# Patient Record
Sex: Male | Born: 1940 | Race: White | Hispanic: No | Smoking: Never smoker
Health system: Southern US, Community
[De-identification: ages and names within clinical notes are randomized; demographics above are authoritative.]

## PROBLEM LIST (undated history)

## (undated) DIAGNOSIS — I10 Essential (primary) hypertension: Secondary | ICD-10-CM

## (undated) HISTORY — PX: OTHER SURGICAL HISTORY: SHX169

---

## 2013-01-14 ENCOUNTER — Encounter (HOSPITAL_COMMUNITY): Payer: Self-pay | Admitting: *Deleted

## 2013-01-14 ENCOUNTER — Emergency Department (HOSPITAL_COMMUNITY)
Admission: EM | Admit: 2013-01-14 | Discharge: 2013-01-14 | Disposition: A | Discharge status: Home / Self Care | Payer: Medicare Other | Attending: Emergency Medicine | Admitting: Emergency Medicine

## 2013-01-14 ENCOUNTER — Emergency Department (HOSPITAL_COMMUNITY): Payer: Medicare Other

## 2013-01-14 DIAGNOSIS — Z951 Presence of aortocoronary bypass graft: Secondary | ICD-10-CM | POA: Insufficient documentation

## 2013-01-14 DIAGNOSIS — Z79899 Other long term (current) drug therapy: Secondary | ICD-10-CM | POA: Insufficient documentation

## 2013-01-14 DIAGNOSIS — Z7901 Long term (current) use of anticoagulants: Secondary | ICD-10-CM | POA: Insufficient documentation

## 2013-01-14 DIAGNOSIS — I10 Essential (primary) hypertension: Secondary | ICD-10-CM | POA: Insufficient documentation

## 2013-01-14 DIAGNOSIS — Z7982 Long term (current) use of aspirin: Secondary | ICD-10-CM | POA: Insufficient documentation

## 2013-01-14 DIAGNOSIS — Y939 Activity, unspecified: Secondary | ICD-10-CM | POA: Insufficient documentation

## 2013-01-14 DIAGNOSIS — S6722XA Crushing injury of left hand, initial encounter: Secondary | ICD-10-CM

## 2013-01-14 DIAGNOSIS — S61209A Unspecified open wound of unspecified finger without damage to nail, initial encounter: Secondary | ICD-10-CM | POA: Insufficient documentation

## 2013-01-14 DIAGNOSIS — W230XXA Caught, crushed, jammed, or pinched between moving objects, initial encounter: Secondary | ICD-10-CM | POA: Insufficient documentation

## 2013-01-14 DIAGNOSIS — Y9289 Other specified places as the place of occurrence of the external cause: Secondary | ICD-10-CM | POA: Insufficient documentation

## 2013-01-14 DIAGNOSIS — S61219A Laceration without foreign body of unspecified finger without damage to nail, initial encounter: Secondary | ICD-10-CM

## 2013-01-14 DIAGNOSIS — S6710XA Crushing injury of unspecified finger(s), initial encounter: Secondary | ICD-10-CM | POA: Insufficient documentation

## 2013-01-14 HISTORY — DX: Essential (primary) hypertension: I10

## 2013-01-14 MED ORDER — AMOXICILLIN 500 MG PO CAPS: 500.0000 mg | ORAL_CAPSULE | Freq: Three times a day (TID) | ORAL | Status: DC

## 2013-01-14 NOTE — ED Provider Notes (Signed)
History    This chart was scribed for non-physician practitioner working with Corey Shi, MD by Leone Payor, ED Scribe. This patient was seen in room WTR9/WTR9 and the patient's care was started at 1918.   CSN: [id number]  Arrival date & time 01/14/13  1918   First MD Initiated Contact with Patient 01/14/13 2212      Chief Complaint  Patient presents with  . Extremity Laceration    The history is provided by the patient. No language interpreter was used.    Corey Chan is a 72 y.o. male who presents to the Emergency Department complaining of a new laceration to the left index finger that occurred about 3 hours ago. Pt states he slammed his finger in the car door, states the door completely closed. Pt believes his tetanus is up to date. Pt takes Coumadin daily.  No other injuries.    Pt is an occasional alcohol user but denies smoking.  Past Medical History  Diagnosis Date  . Hypertension     Past Surgical History  Procedure Laterality Date  . Heart bypass      mechanical mitral valve    History reviewed. No pertinent family history.  History  Substance Use Topics  . Smoking status: Never Smoker   . Smokeless tobacco: Not on file  . Alcohol Use: Yes     Comment: occassional       Review of Systems  Skin: Positive for wound.    Allergies  Review of patient's allergies indicates no known allergies.  Home Medications   Current Outpatient Rx  Name  Route  Sig  Dispense  Refill  . aspirin EC 81 MG tablet   Oral   Take 81 mg by mouth daily.         Marland Kitchen atenolol (TENORMIN) 50 MG tablet   Oral   Take 25 mg by mouth daily.         Marland Kitchen atorvastatin (LIPITOR) 10 MG tablet   Oral   Take 10 mg by mouth daily.         . tamsulosin (FLOMAX) 0.4 MG CAPS   Oral   Take 0.4 mg by mouth daily.         Marland Kitchen warfarin (COUMADIN) 5 MG tablet   Oral   Take 5 mg by mouth daily.           BP 134/87  Pulse 77  Temp(Src) 98.4 F (36.9 C) (Oral)  Resp 20   SpO2 96%  Physical Exam  Nursing note and vitals reviewed. Constitutional: He is oriented to person, place, and time. He appears well-developed and well-nourished. No distress.  HENT:  Head: Normocephalic and atraumatic.  Eyes: EOM are normal.  Neck: Neck supple. No tracheal deviation present.  Cardiovascular: Normal rate.   Pulmonary/Chest: Effort normal. No respiratory distress.  Musculoskeletal: Normal range of motion.  Full ROM of left index finger. Normal sensation over distal phalanx.  Good cap refill < 2 seconds. Good strength with flexion and extension against resistance. Tender to the distal aspect of distal phalanx.     Neurological: He is alert and oriented to person, place, and time.  Skin: Skin is warm and dry.  2 cm laceration to the distal phalanx over radial aspect. Hemostatic.   Psychiatric: He has a normal mood and affect. His behavior is normal.    ED Course  Procedures (including critical care time)  DIAGNOSTIC STUDIES: Oxygen Saturation is 96% on room air, adequate by my interpretation.  COORDINATION OF CARE: 10:23 PM Discussed treatment plan with pt at bedside and pt agreed to plan.    LACERATION REPAIR Performed by: Lottie Mussel Authorized by: Jaynie Crumble A Consent: Verbal consent obtained. Risks and benefits: risks, benefits and alternatives were discussed Consent given by: patient Patient identity confirmed: provided demographic data Prepped and Draped in normal sterile fashion Wound explored  Laceration Location: left index finger  Laceration Length: 2cm  No Foreign Bodies seen or palpated  Anesthesia: local infiltration  Local anesthetic: lidocaine 2% wo epinephrine  Anesthetic total: 2 ml  Irrigation method: syringe Amount of cleaning: standard  Skin closure: prolene 5.0  Number of sutures: 5  Technique: simple interrupted  Patient tolerance: Patient tolerated the procedure well with no immediate  complications.  1. Laceration of finger, initial encounter   2. Crushing injury of finger of left hand, initial encounter       MDM  Pt with left distal finger laceration and crush injury. Infer laceration repaired. Fixed with sutures. He tolerated procedure well. Possible non displaced fx of the distal middle phalanx. Pt has no pain over that area. Pt's pain is mainly to the distal phalanx and tip of the finger. Pt will be started on amoxil due to his hx of valve replacement. Follow up with pcp for suture removal.    I personally performed the services described in this documentation, which was scribed in my presence. The recorded information has been reviewed and is accurate.   Lottie Mussel, PA-C 01/15/13 [phone]

## 2013-01-14 NOTE — ED Notes (Signed)
Wound dressed with bacitracin, gauze and coban.

## 2013-01-14 NOTE — ED Notes (Signed)
Pt ambulatory to exam room with steady gait.  

## 2013-01-14 NOTE — ED Notes (Signed)
Pt slammed in left forefinger in car door,  Bleeding controlled,

## 2013-01-15 NOTE — ED Provider Notes (Signed)
Medical screening examination/treatment/procedure(s) were performed by non-physician practitioner and as supervising physician I was immediately available for consultation/collaboration.  Jones Skene, M.D.   Jones Skene, MD 01/15/13 4098

## 2013-11-29 IMAGING — CR DG FINGER INDEX 2+V*L*
3 series · 3 of 3 positions shown · non-contrast
Comparison: None.

CLINICAL DATA: Left index finger trauma, with laceration and pain.

LEFT INDEX FINGER 2+V

[x finger pa left]
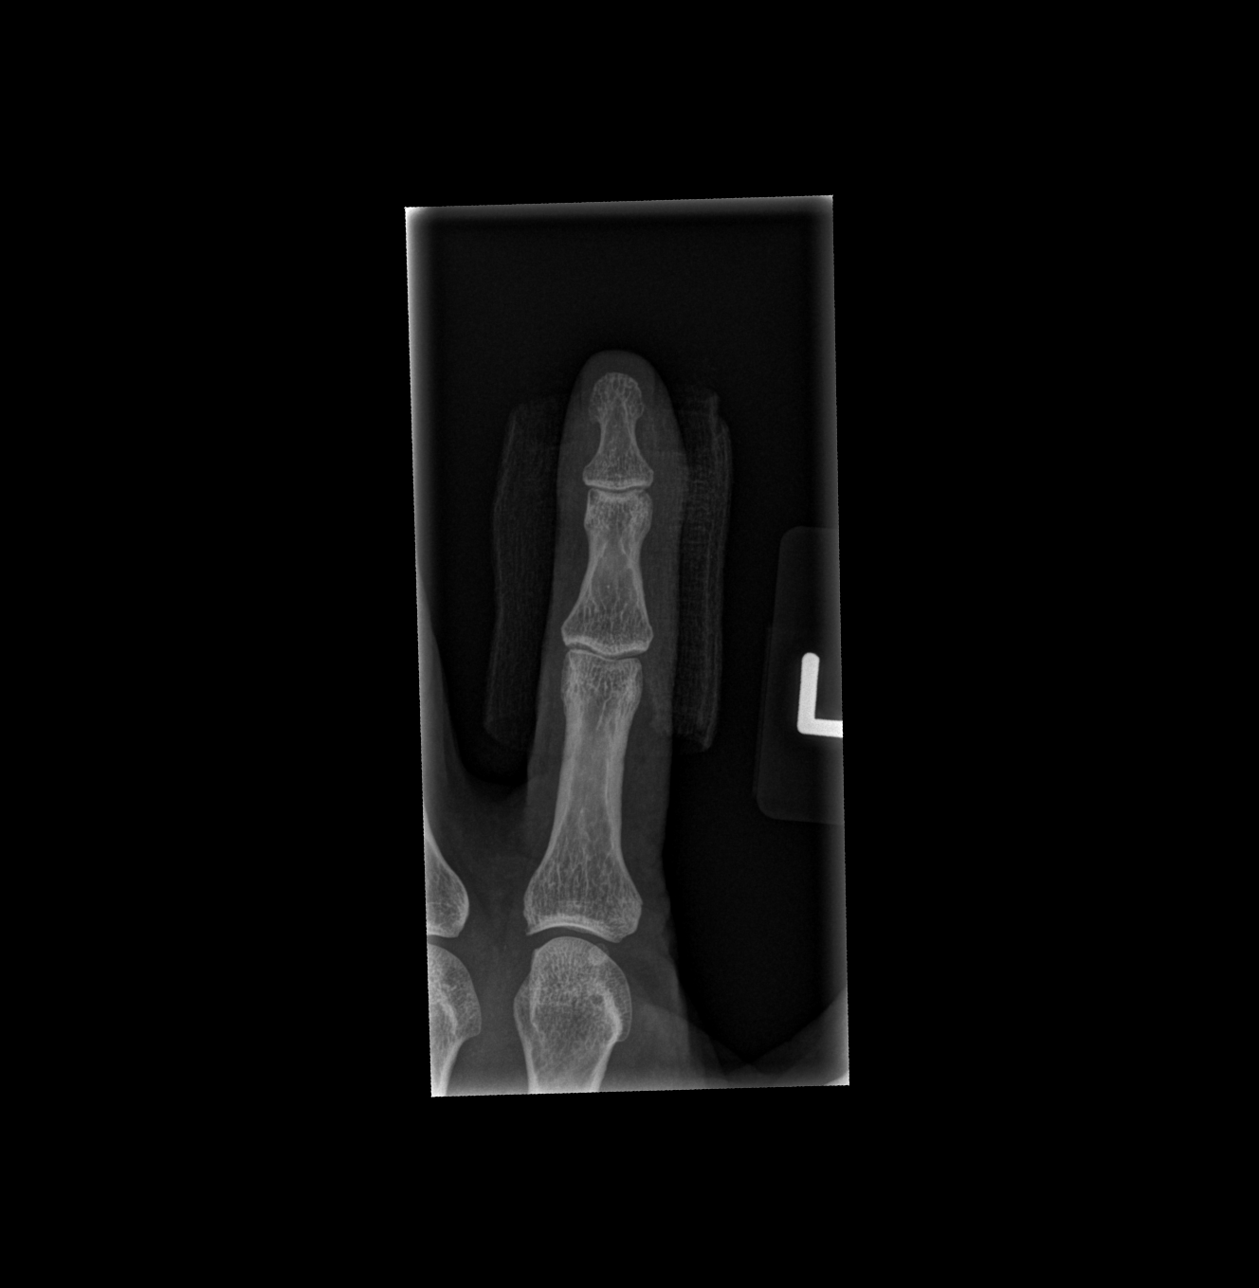

[x finger lat left]
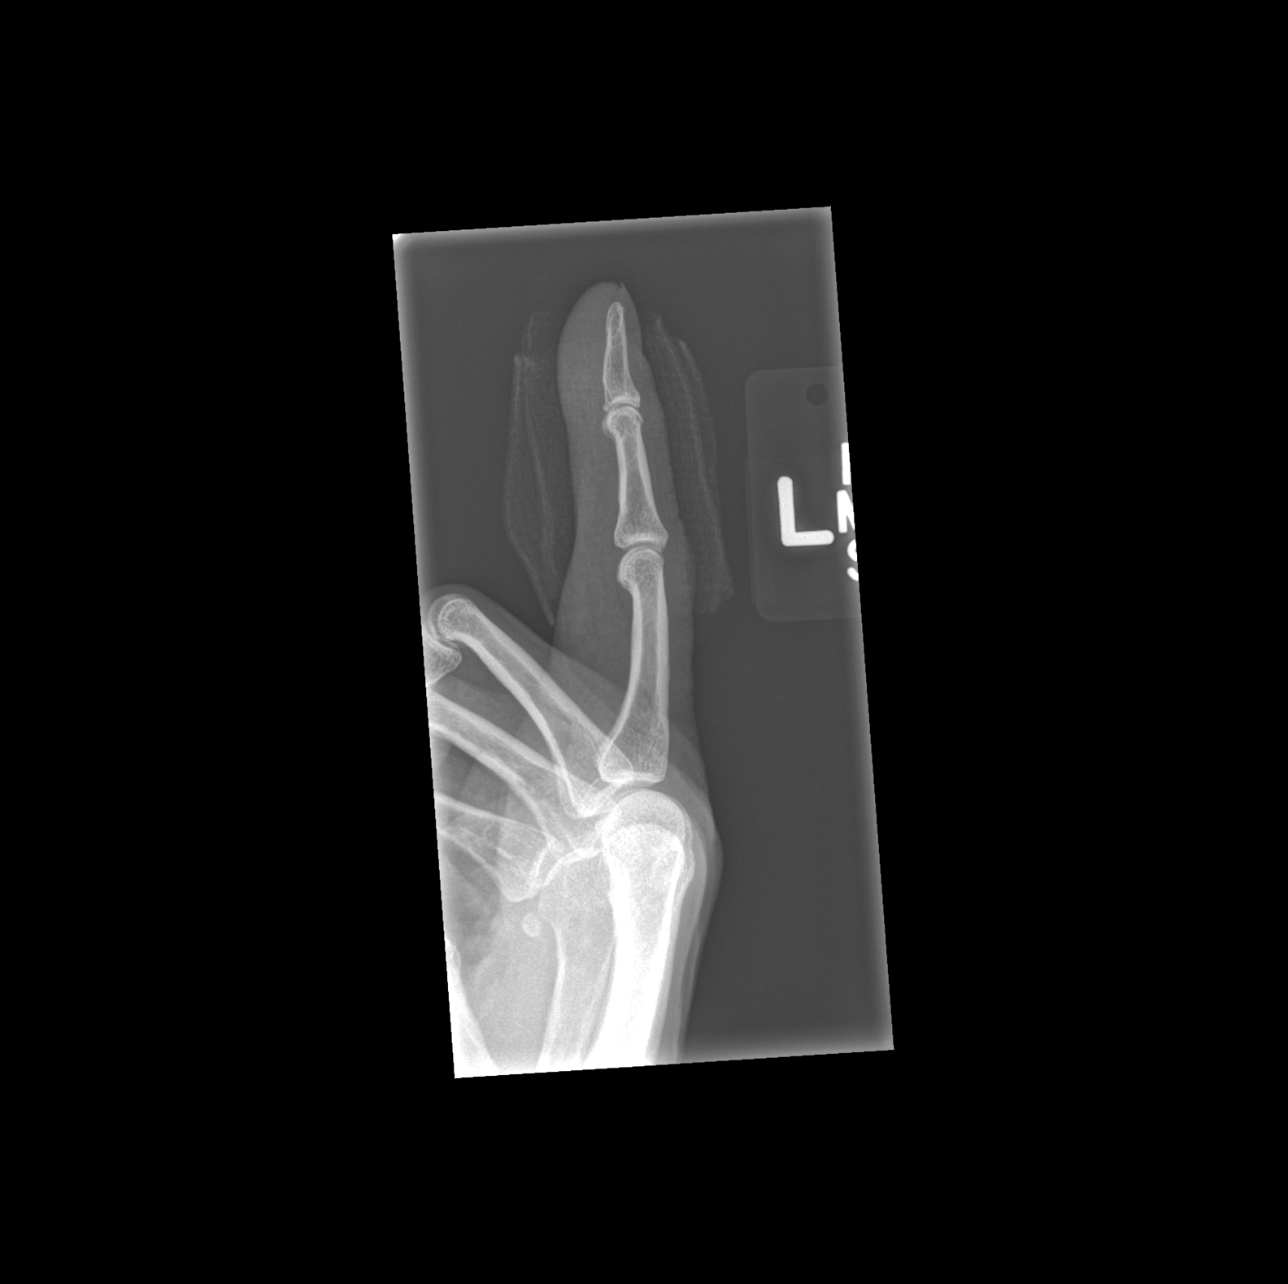

[x finger obl left]
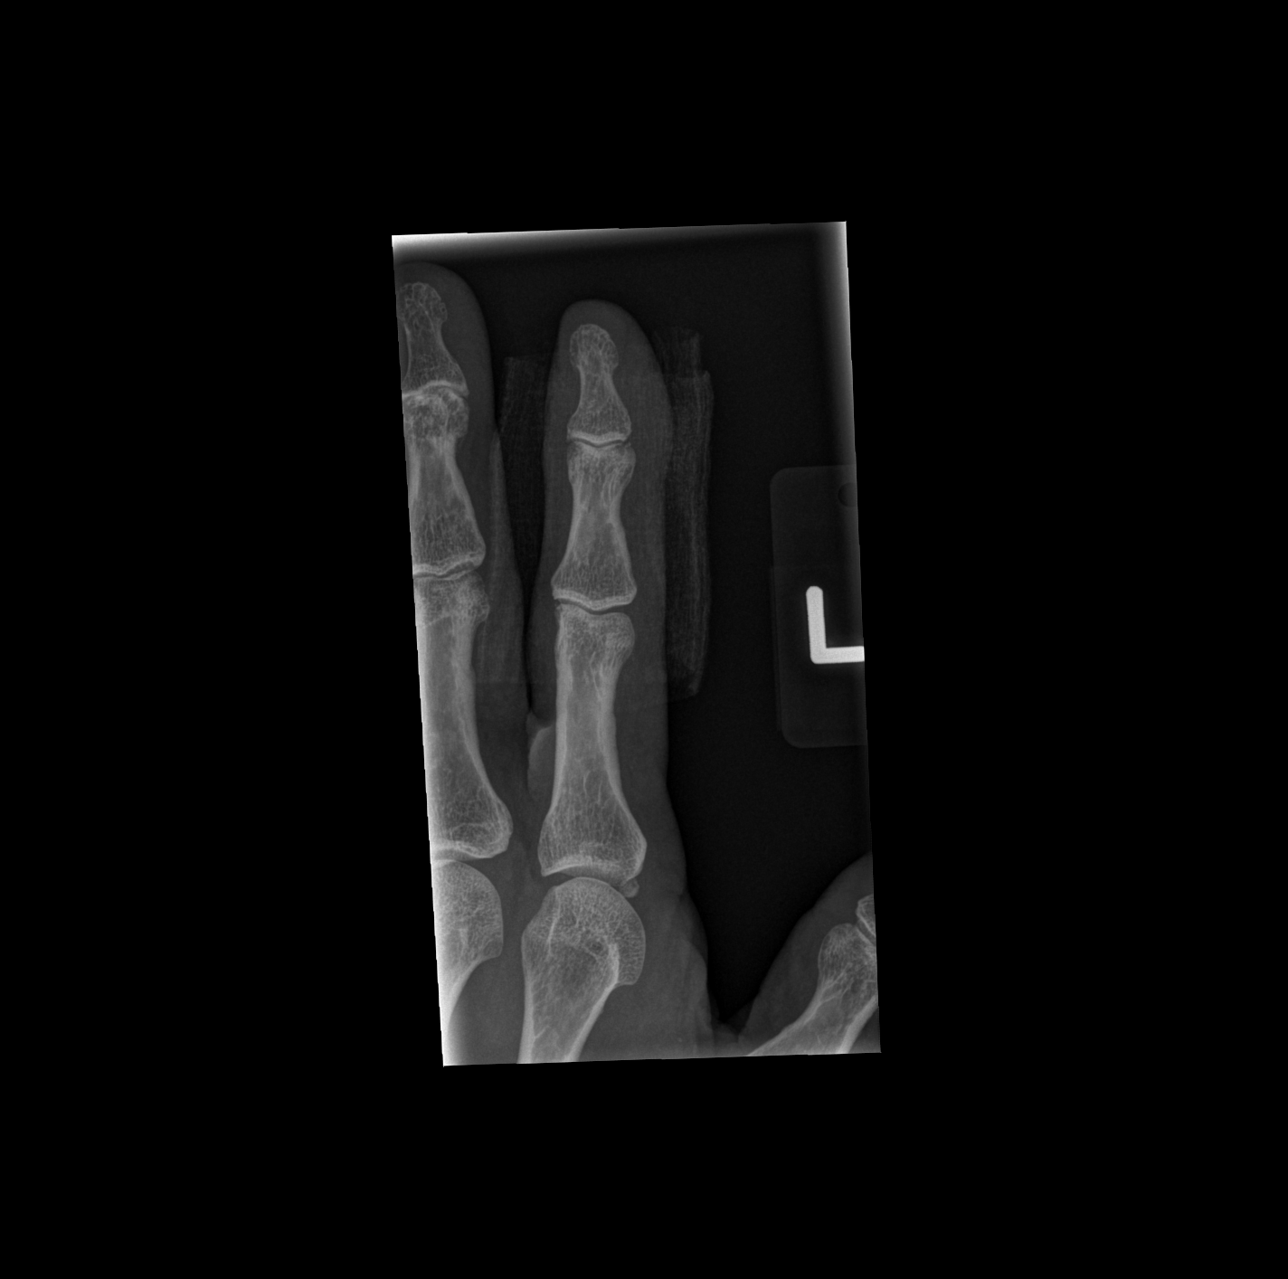

[3 of 3 positions shown; findings below may reference images not displayed]

FINDINGS: Bandage overlies the second digit of left hand.  No
radiopaque foreign body.  No dislocation.  Mild cortical
irregularity along the distal aspect of the middle phalanx.  Tiny
calcific density along the base of the distal phalanx second digit
radial margin on the oblique view.
IMPRESSION: Mild cortical irregularity along the distal aspect of the middle
phalanx second digit may reflect a nondisplaced fracture.

Tiny calcific density along the base of the distal phalanx second
digit radial margin on the oblique view may reflect an ossicle or
tiny avulsed fragment.

## 2023-04-27 ENCOUNTER — Encounter: Payer: Self-pay | Admitting: Urology

## 2023-04-27 ENCOUNTER — Ambulatory Visit: Payer: Medicare HMO | Admitting: Urology

## 2023-04-27 VITALS — BP 152/77 | HR 81 | Ht 67.0 in | Wt 160.0 lb

## 2023-04-27 DIAGNOSIS — N138 Other obstructive and reflux uropathy: Secondary | ICD-10-CM

## 2023-04-27 DIAGNOSIS — N529 Male erectile dysfunction, unspecified: Secondary | ICD-10-CM

## 2023-04-27 DIAGNOSIS — N401 Enlarged prostate with lower urinary tract symptoms: Secondary | ICD-10-CM

## 2023-04-27 LAB — MICROSCOPIC EXAMINATION
Bacteria, UA: NONE SEEN
Cast Type: NONE SEEN
Casts: NONE SEEN /lpf
Crystal Type: NONE SEEN
Crystals: NONE SEEN
Epithelial Cells (non renal): NONE SEEN /hpf (ref 0–10)
Mucus, UA: NONE SEEN
Renal Epithel, UA: NONE SEEN /hpf
Trichomonas, UA: NONE SEEN
Yeast, UA: NONE SEEN

## 2023-04-27 LAB — URINALYSIS, ROUTINE W REFLEX MICROSCOPIC
Bilirubin, UA: NEGATIVE
Glucose, UA: NEGATIVE
Ketones, UA: NEGATIVE
Leukocytes,UA: NEGATIVE
Nitrite, UA: NEGATIVE
Protein,UA: NEGATIVE
Specific Gravity, UA: 1.025 (ref 1.005–1.030)
Urobilinogen, Ur: 0.2 mg/dL (ref 0.2–1.0)
pH, UA: 6 (ref 5.0–7.5)

## 2023-04-27 NOTE — Progress Notes (Signed)
   Assessment: 1. BPH with obstruction/lower urinary tract symptoms   2. Erectile dysfunction of organic origin     Plan: Continue comb med tx (tam + fin) for bph/luts Continue sildenafil for ED FU 1 yr or sooner if problems arise  Chief Complaint: Check up  HPI: Corey Chan is a 82 y.o. male who presents for continued evaluation of a number of urologic issues. Patient last seen by me 05/2020.  Today I reviewed his prior records including past 3 yrs.  Patient has a long history of BPH/lower urinary tract symptoms on combination medical therapy with tamsulosin and finasteride with good results.  His IPSS has been in the 5-6 range over the last several years. IPSS today = 5  Patient also has ED and has used both cialis and viagra in the past.  Saw Dr. Alecia Lemming 11/2022 at [facility] and prescribed 100mg  sildenafil.  This works fairly well.  Discussed importance of taking on an empty stomach.  Patient has a history of significant microscopic hematuria (on Coumadin for valve replacement). Negative hematuria evaluation 05/2020 with CT (bilateral nonobstructing stones) and cystoscopy which demonstrated a large friable prostate with bladder trabeculation and cellule formation.  Patient also has a prior history of elevated PSA--see below  PSA / Biopsy DATA-- 12/2013  12 (9%free)-- neg trus/bx, Volume 53 01/2014  Started finasteride 06/2014  7.1 09/2015 2.85 05/2016  2.85 02/2017  3.45 with elevated 4K (31) 02/2017  Mp prostate MRI--neg for macroscopic or high grade lesions 07/2017 2.07 01/2018  1.99 03/2019  1.95 03/2020  1.81       Portions of the above documentation were copied from a prior visit for review purposes only.  Allergies: No Known Allergies  PMH: Past Medical History:  Diagnosis Date   Hypertension     PSH: Past Surgical History:  Procedure Laterality Date   heart bypass     mechanical mitral valve    SH: Social History   Tobacco Use   Smoking status: Never   Substance Use Topics   Alcohol use: Yes    Comment: occassional     ROS: Constitutional:  Negative for fever, chills, weight loss CV: Negative for chest pain, previous MI, hypertension Respiratory:  Negative for shortness of breath, wheezing, sleep apnea, frequent cough GI:  Negative for nausea, vomiting, bloody stool, GERD  PE: BP (!) 152/77   Pulse 81   Ht 5\' 7"  (1.702 m)   Wt 160 lb (72.6 kg)   BMI 25.06 kg/m  GENERAL APPEARANCE:  Well appearing, well developed, well nourished, NAD  GU: Normal external genitalia DRE: Normal sphincter tone; prostate is approximately 35 to 40 g without evidence of nodules or induration-benign to palpation   Results: UA-negative for infection or significant hematuria

## 2023-11-26 ENCOUNTER — Telehealth: Payer: Self-pay | Admitting: Urology

## 2023-11-26 NOTE — Telephone Encounter (Signed)
Pt called concerning a medication that his previous urologist prescribed. That he is will soon be out of.The medication is finasteride 5mg  he has 15 left. Can you please advise.

## 2023-11-29 MED ORDER — FINASTERIDE 5 MG PO TABS: 5.0000 mg | ORAL_TABLET | Freq: Every day | ORAL | 3 refills | Status: DC

## 2023-11-29 NOTE — Addendum Note (Signed)
Addended by: Joline Maxcy on: 11/29/2023 12:05 PM   Modules accepted: Orders

## 2024-01-10 ENCOUNTER — Telehealth: Payer: Self-pay | Admitting: Urology

## 2024-01-10 MED ORDER — TAMSULOSIN HCL 0.4 MG PO CAPS: 0.4000 mg | ORAL_CAPSULE | Freq: Every day | ORAL | 3 refills | Status: AC

## 2024-01-10 MED ORDER — FINASTERIDE 5 MG PO TABS: 5.0000 mg | ORAL_TABLET | Freq: Every day | ORAL | 3 refills | Status: AC

## 2024-01-10 NOTE — Addendum Note (Signed)
 Addended by: Joline Maxcy on: 01/10/2024 12:26 PM   Modules accepted: Orders

## 2024-01-10 NOTE — Telephone Encounter (Signed)
 Pt called stated he was out of tamsulosin (FLOMAX) 0.4 MG CAPS [[id number]] and wants to know if you could call this in. Pt would like to know when this is done. Thank You.

## 2024-04-26 ENCOUNTER — Ambulatory Visit: Payer: Medicare HMO | Admitting: Urology

## 2024-05-01 ENCOUNTER — Ambulatory Visit: Admitting: Urology

## 2024-05-01 ENCOUNTER — Encounter: Payer: Self-pay | Admitting: Urology

## 2024-05-01 VITALS — BP 163/87 | HR 66 | Ht 68.0 in | Wt 162.0 lb

## 2024-05-01 DIAGNOSIS — N138 Other obstructive and reflux uropathy: Secondary | ICD-10-CM

## 2024-05-01 DIAGNOSIS — N401 Enlarged prostate with lower urinary tract symptoms: Secondary | ICD-10-CM

## 2024-05-01 DIAGNOSIS — R972 Elevated prostate specific antigen [PSA]: Secondary | ICD-10-CM | POA: Diagnosis not present

## 2024-05-01 DIAGNOSIS — R3129 Other microscopic hematuria: Secondary | ICD-10-CM | POA: Diagnosis not present

## 2024-05-01 DIAGNOSIS — N529 Male erectile dysfunction, unspecified: Secondary | ICD-10-CM | POA: Diagnosis not present

## 2024-05-01 LAB — URINALYSIS, ROUTINE W REFLEX MICROSCOPIC
Bilirubin, UA: NEGATIVE
Glucose, UA: NEGATIVE
Ketones, UA: NEGATIVE
Nitrite, UA: NEGATIVE
Protein,UA: NEGATIVE
Specific Gravity, UA: 1.02 (ref 1.005–1.030)
Urobilinogen, Ur: 1 mg/dL (ref 0.2–1.0)
pH, UA: 6.5 (ref 5.0–7.5)

## 2024-05-01 LAB — MICROSCOPIC EXAMINATION

## 2024-05-01 MED ORDER — SILDENAFIL CITRATE 100 MG PO TABS: 100.0000 mg | ORAL_TABLET | ORAL | 11 refills | Status: AC | PRN

## 2024-05-01 MED ORDER — VARDENAFIL HCL 20 MG PO TABS: 20.0000 mg | ORAL_TABLET | Freq: Every day | ORAL | 11 refills | Status: AC | PRN

## 2024-05-01 NOTE — Progress Notes (Signed)
 Assessment: 1. BPH with obstruction/lower urinary tract symptoms   2. Erectile dysfunction of organic origin   3. Microscopic hematuria - prior negative evaluation 2021   4. Elevated PSA     Plan: I personally reviewed the patient's chart including provider notes, lab results. Continue tamsulosin  and finasteride  PSA today per patient request Trial of vardenafil  20 mg prn intercourse. Rx provided. Return to office in 1 year  Chief Complaint: Chief Complaint  Patient presents with   Benign Prostatic Hypertrophy    HPI: Corey Chan is a 83 y.o. male who presents for continued evaluation of BPH with lower urinary tract symptoms, ED, and history of microscopic hematuria. He was previously followed by Dr. Shona and was last seen in July 2024. He continued on combination therapy with tamsulosin  and finasteride  at his visit in 7/24. He also continued on sildenafil  for his ED.  He returns today for follow-up.  He continues on tamsulosin  and finasteride  for his BPH.  He reports that his lower urinary tract symptoms are stable and well-controlled.  He does have occasional urgency.  No dysuria or gross hematuria. IPSS = 6/1. He has continued to use sildenafil  with minimal improvement in his erectile dysfunction.  He previously tried tadalafil without benefit.  Urologic History: Patient has a long history of BPH/lower urinary tract symptoms on combination medical therapy with tamsulosin  and finasteride  with good results.  His IPSS has been in the 5-6 range over the last several years. IPSS = 5   Patient also has ED and has used both cialis and viagra  in the past.  Saw Dr. Gillian 11/2022 at [facility] and prescribed 100 mg sildenafil .  This has worked fairly well.     Patient has a history of significant microscopic hematuria (on Coumadin for valve replacement). Negative hematuria evaluation 05/2020 with CT (bilateral nonobstructing stones) and cystoscopy which demonstrated a large friable prostate  with bladder trabeculation and cellule formation.   Patient also has a prior history of elevated PSA--see below   PSA / Biopsy DATA-- 12/2013             12 (9%free)-- neg trus/bx, Volume 53 01/2014             Started finasteride  06/2014             7.1 12/20162.85 05/2016             2.85 02/2017             3.45 with elevated 4K (31) 02/2017             Mp prostate MRI--neg for macroscopic or high grade lesions 10/20182.07 01/2018             1.99 03/2019             1.95 03/2020             1.81  Portions of the above documentation were copied from a prior visit for review purposes only.  Allergies: No Active Allergies  PMH: Past Medical History:  Diagnosis Date   Hypertension     PSH: Past Surgical History:  Procedure Laterality Date   heart bypass     mechanical mitral valve    SH: Social History   Tobacco Use   Smoking status: Never  Substance Use Topics   Alcohol use: Yes    Comment: occassional     ROS: Constitutional:  Negative for fever, chills, weight loss CV: Negative for chest pain, previous MI, hypertension Respiratory:  Negative for shortness of breath, wheezing, sleep apnea, frequent cough GI:  Negative for nausea, vomiting, bloody stool, GERD  PE: BP (!) 163/87   Pulse 66   Ht 5' 8 (1.727 m)   Wt 162 lb (73.5 kg)   BMI 24.63 kg/m  GENERAL APPEARANCE:  Well appearing, well developed, well nourished, NAD HEENT:  Atraumatic, normocephalic, oropharynx clear NECK:  Supple without lymphadenopathy or thyromegaly ABDOMEN:  Soft, non-tender, no masses EXTREMITIES:  Moves all extremities well, without clubbing, cyanosis, or edema NEUROLOGIC:  Alert and oriented x 3, normal gait, CN II-XII grossly intact MENTAL STATUS:  appropriate BACK:  Non-tender to palpation, No CVAT SKIN:  Warm, dry, and intact GU: Prostate: 50 g, NT, no nodules Rectum: Normal tone,  no masses or tenderness    Results: U/A: 0-5 WBCs, 3-10 RBCs

## 2024-05-02 ENCOUNTER — Ambulatory Visit: Payer: Self-pay | Admitting: Urology

## 2024-05-02 LAB — PSA: Prostate Specific Ag, Serum: 2 ng/mL (ref 0.0–4.0)

## 2025-05-01 ENCOUNTER — Ambulatory Visit: Admitting: Urology
